# Patient Record
Sex: Female | Born: 1955 | Race: White | Hispanic: No | Marital: Married | Smoking: Current every day smoker
Health system: Southern US, Community
[De-identification: ages and names within clinical notes are randomized; demographics above are authoritative.]

## PROBLEM LIST (undated history)

## (undated) DIAGNOSIS — M1990 Unspecified osteoarthritis, unspecified site: Secondary | ICD-10-CM

## (undated) DIAGNOSIS — IMO0001 Reserved for inherently not codable concepts without codable children: Secondary | ICD-10-CM

## (undated) DIAGNOSIS — K219 Gastro-esophageal reflux disease without esophagitis: Secondary | ICD-10-CM

## (undated) DIAGNOSIS — D381 Neoplasm of uncertain behavior of trachea, bronchus and lung: Secondary | ICD-10-CM

## (undated) DIAGNOSIS — R42 Dizziness and giddiness: Secondary | ICD-10-CM

## (undated) HISTORY — PX: COLONOSCOPY: SHX174

## (undated) HISTORY — DX: Dizziness and giddiness: R42

## (undated) HISTORY — DX: Unspecified osteoarthritis, unspecified site: M19.90

## (undated) HISTORY — PX: COLON SURGERY: SHX602

## (undated) HISTORY — PX: BACK SURGERY: SHX140

## (undated) HISTORY — DX: Gastro-esophageal reflux disease without esophagitis: K21.9

## (undated) HISTORY — DX: Neoplasm of uncertain behavior of trachea, bronchus and lung: D38.1

---

## 2006-02-06 ENCOUNTER — Ambulatory Visit: Payer: Self-pay | Admitting: Internal Medicine

## 2006-12-23 HISTORY — PX: COLON RESECTION: SHX5231

## 2007-01-19 ENCOUNTER — Ambulatory Visit: Payer: Self-pay | Admitting: General Surgery

## 2007-02-04 ENCOUNTER — Other Ambulatory Visit: Payer: Self-pay

## 2007-02-11 ENCOUNTER — Inpatient Hospital Stay: Payer: Self-pay | Admitting: General Surgery

## 2007-03-20 ENCOUNTER — Ambulatory Visit: Payer: Self-pay | Admitting: Internal Medicine

## 2008-06-26 ENCOUNTER — Emergency Department: Payer: Self-pay | Admitting: Emergency Medicine

## 2008-12-08 ENCOUNTER — Emergency Department: Payer: Self-pay | Admitting: Internal Medicine

## 2010-03-10 ENCOUNTER — Emergency Department: Payer: Self-pay | Admitting: Unknown Physician Specialty

## 2010-03-19 ENCOUNTER — Ambulatory Visit: Payer: Self-pay | Admitting: Internal Medicine

## 2010-03-23 ENCOUNTER — Ambulatory Visit: Payer: Self-pay | Admitting: Oncology

## 2010-04-01 ENCOUNTER — Inpatient Hospital Stay: Payer: Self-pay | Admitting: Internal Medicine

## 2010-04-11 ENCOUNTER — Ambulatory Visit: Payer: Self-pay | Admitting: Oncology

## 2010-04-22 ENCOUNTER — Ambulatory Visit: Payer: Self-pay | Admitting: Oncology

## 2010-04-24 ENCOUNTER — Ambulatory Visit: Payer: Self-pay | Admitting: Cardiothoracic Surgery

## 2010-05-23 ENCOUNTER — Ambulatory Visit: Payer: Self-pay | Admitting: Oncology

## 2010-09-15 IMAGING — CR DG CHEST 2V
1 series · 2 of 2 positions shown · non-contrast
Comparison: none

REASON FOR EXAM: sob
COMMENTS:   May transport without cardiac monitor

[Series 1: view not recorded · 0.17mm/px · 2 of 2 slices shown]
[im 1/2]
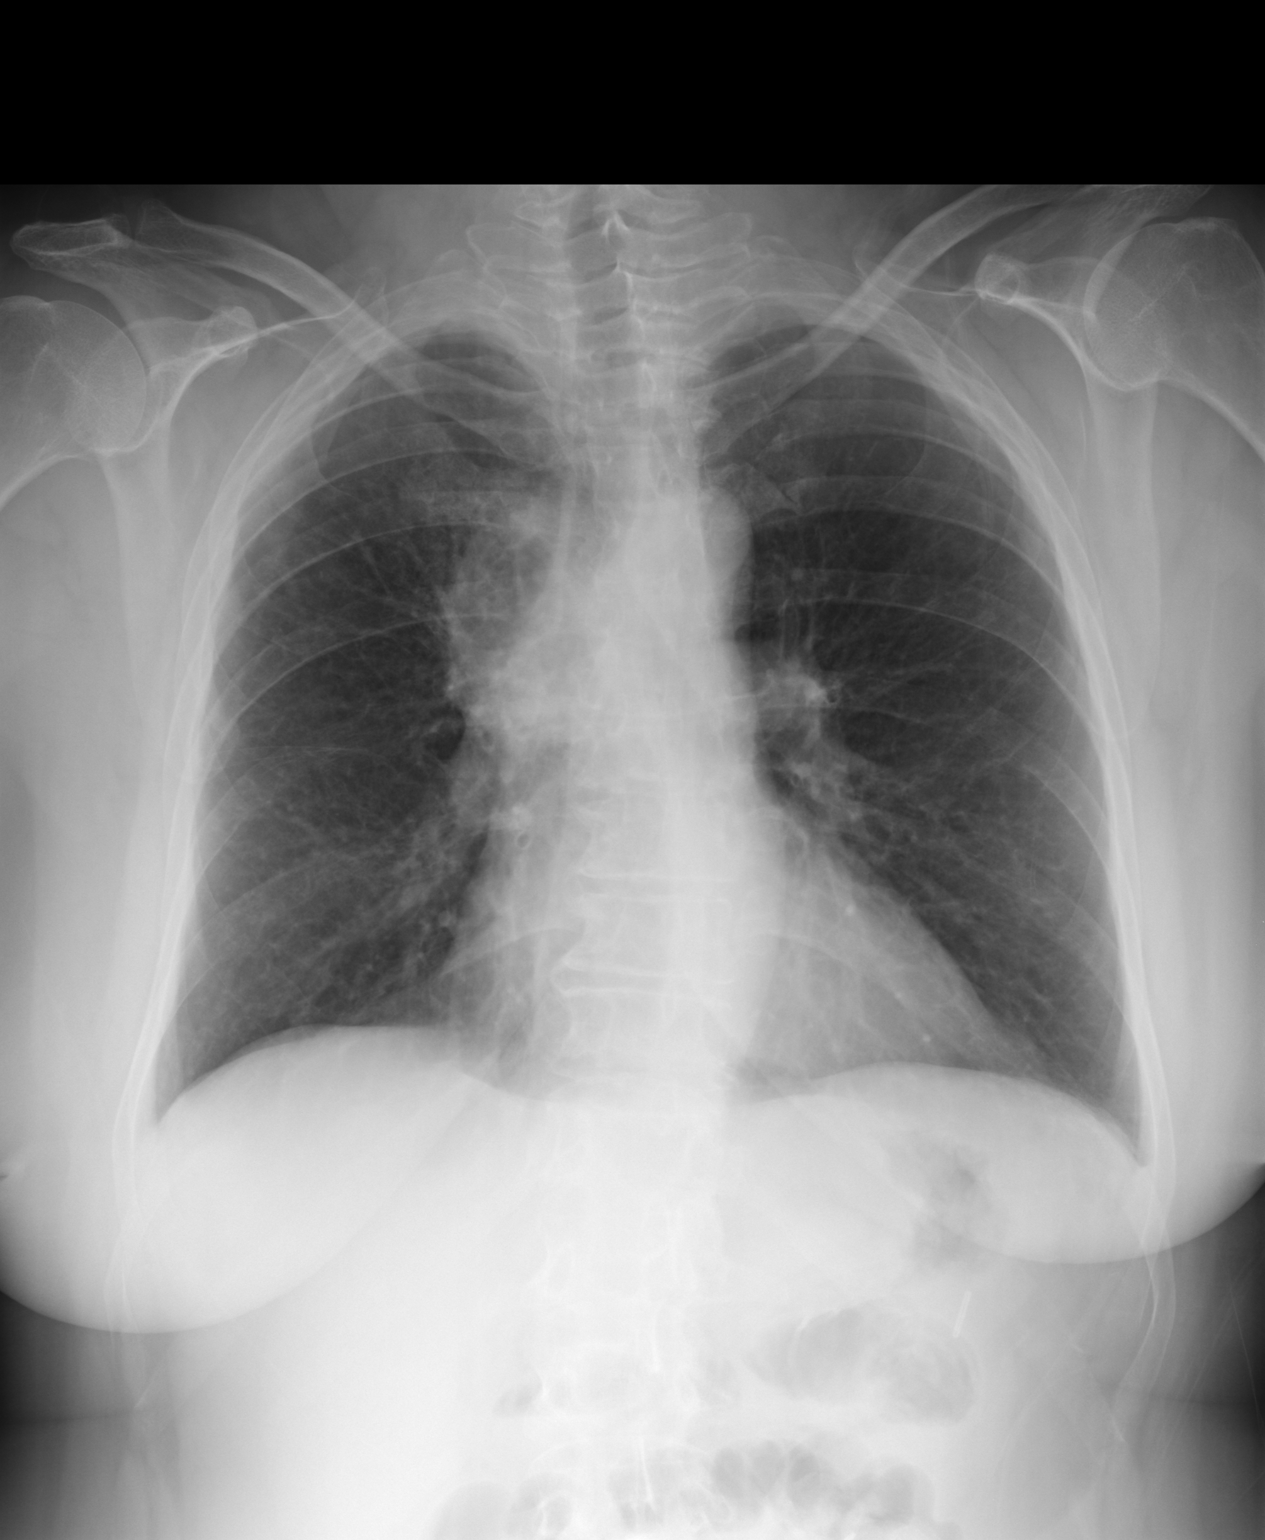
[im 2/2]
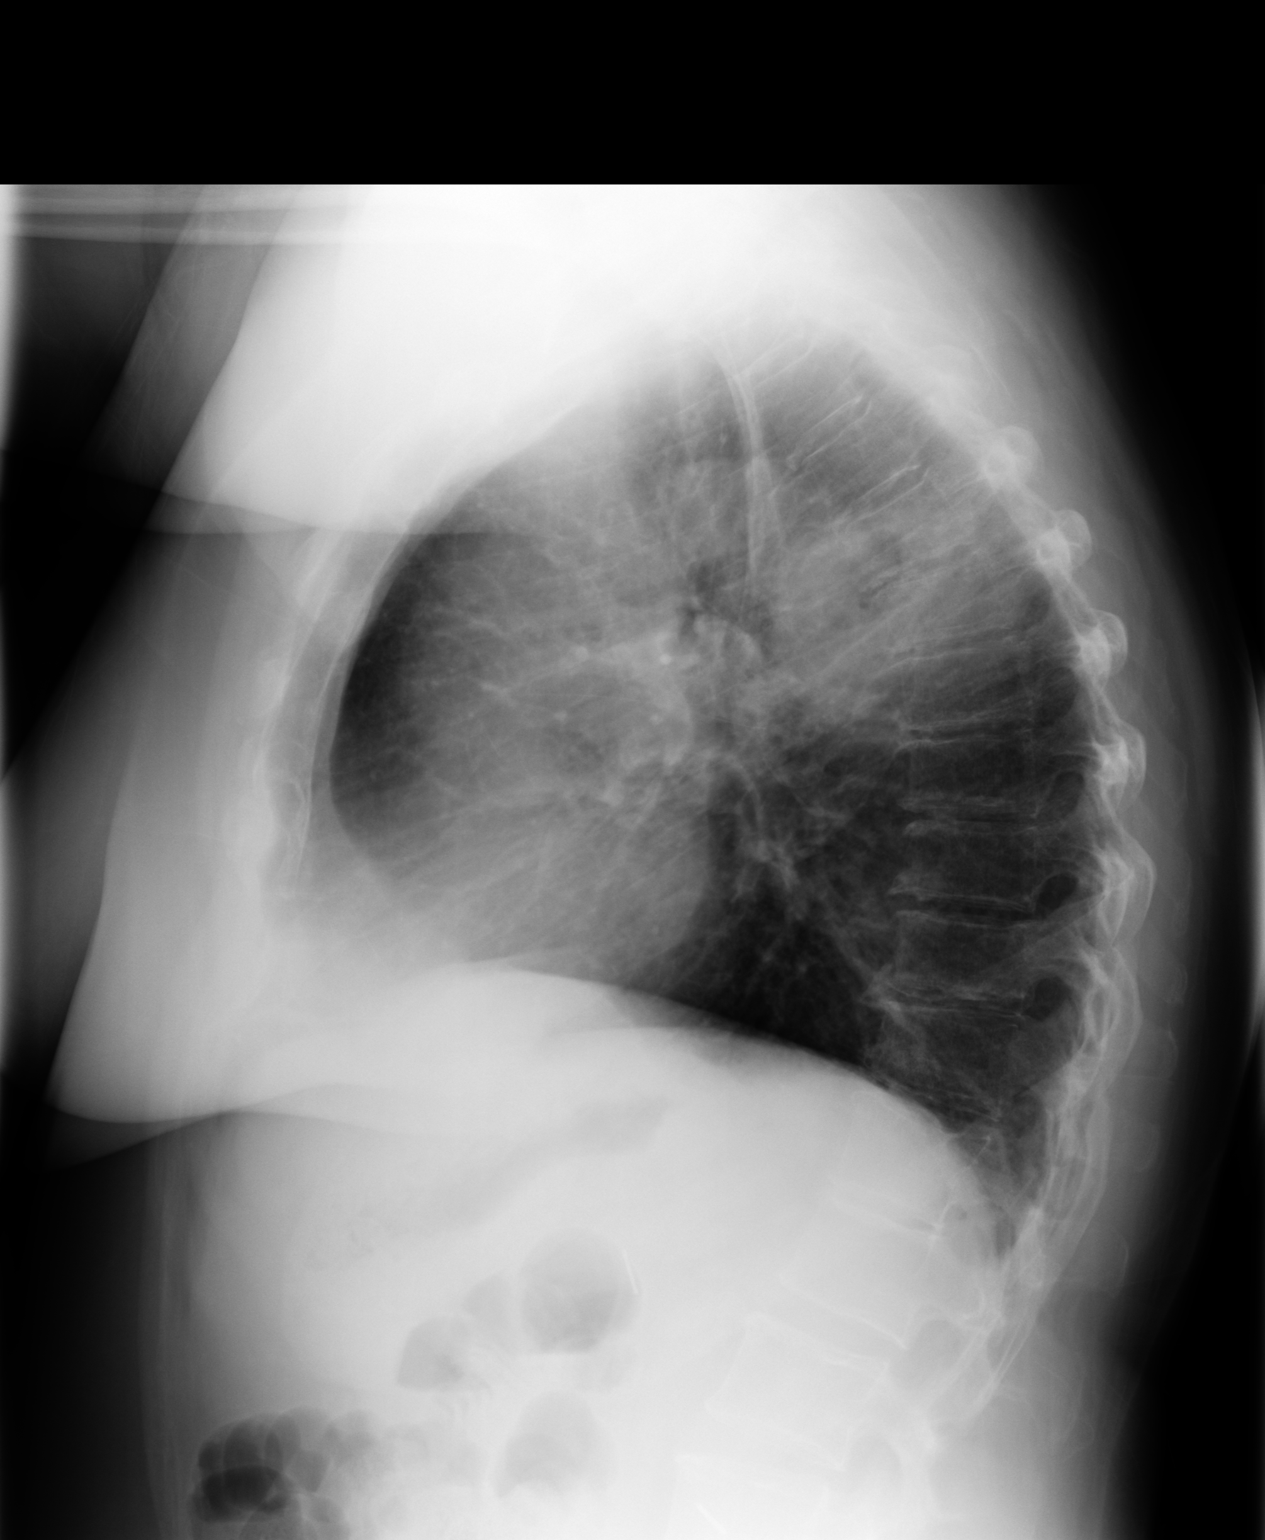

[2 of 2 positions shown; findings below may reference images not displayed]

PROCEDURE:     DXR - DXR CHEST PA (OR AP) AND LATERAL  - April 01, 2010  [DATE]

RESULT:     Comparison made to the study 19 March, 2010.

There is soft tissue fullness in the right paratracheal region. The lungs
are well-expanded. The heart is not enlarged and the pulmonary vascularity
is not engorged. I see no pleural effusion.
IMPRESSION: The findings are suspicious for a mass in the right
suprahilar region extending posteriorly likely in the superior segment of
the right lower lobe or inferior aspect of the right upper lobe. The
findings are worrisome for malignancy. Followup chest CT scanning would be
of value.

## 2010-10-08 IMAGING — CT CT CHEST W/O CM
1 series · 16 of 32 positions shown, 20 images · non-contrast
Comparison: none

REASON FOR EXAM: R Lung Mass
COMMENTS:

PROCEDURE:     CT  - CT CHEST WITHOUT CONTRAST  - April 24, 2010  [DATE]
RESULT:
HISTORY: Lung mass.

[Series 2: soft tissue · axial · 0.66mm/px · z∈[-449,-294]mm · 16 of 36 slices shown, 20 images]
[im 3/36  soft-tissue]
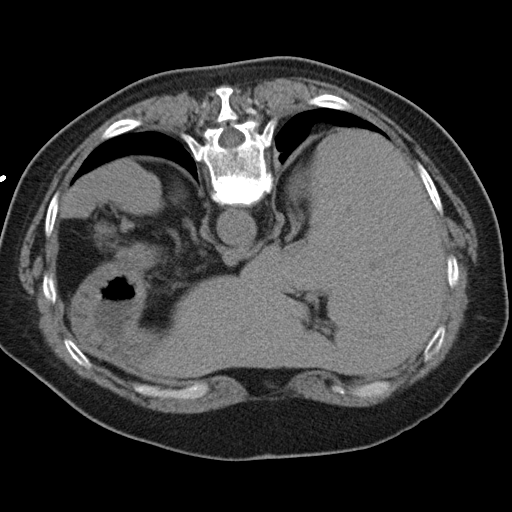
[im 3/36  bone]
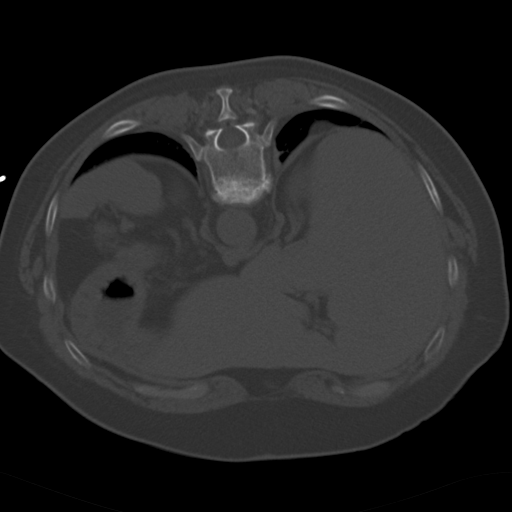
[im 5/36  soft-tissue]
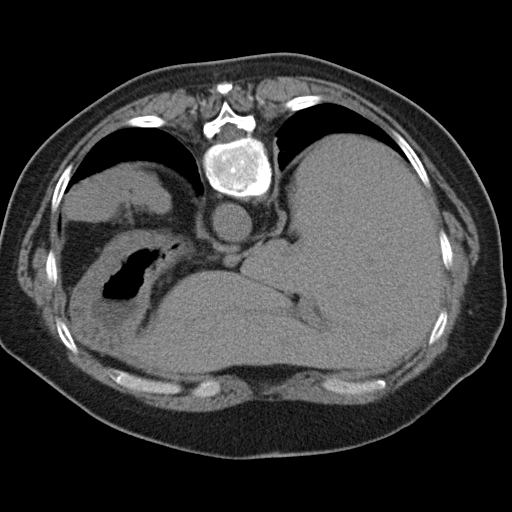
[im 7/36  soft-tissue]
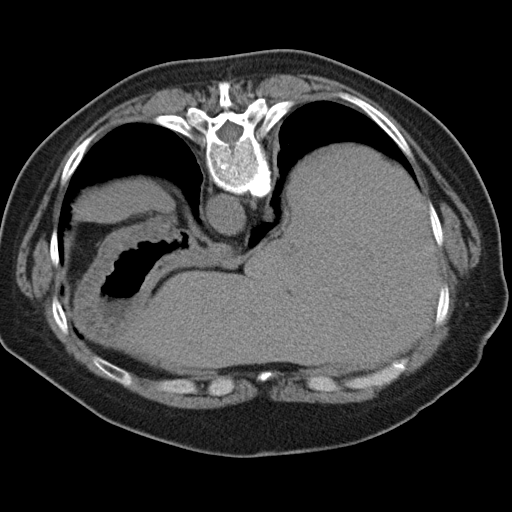
[im 10/36  soft-tissue]
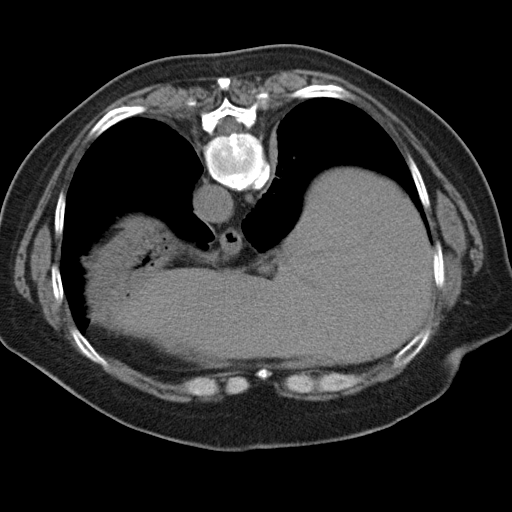
[im 12/36  soft-tissue]
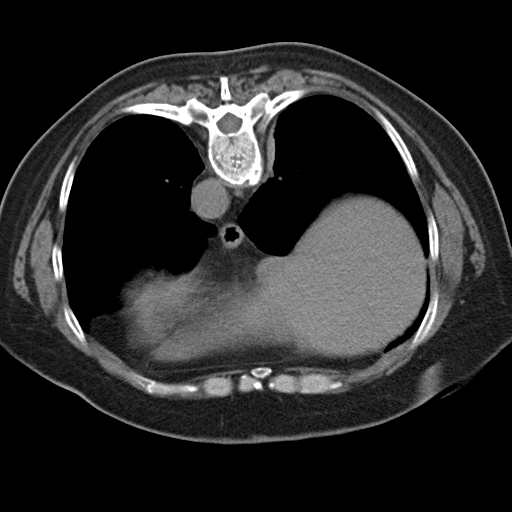
[im 14/36  soft-tissue]
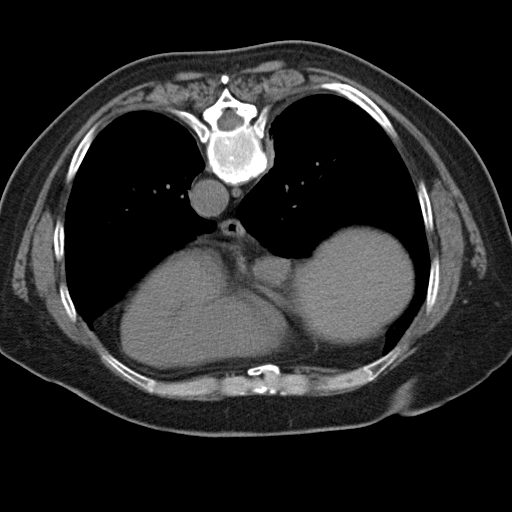
[im 16/36  soft-tissue]
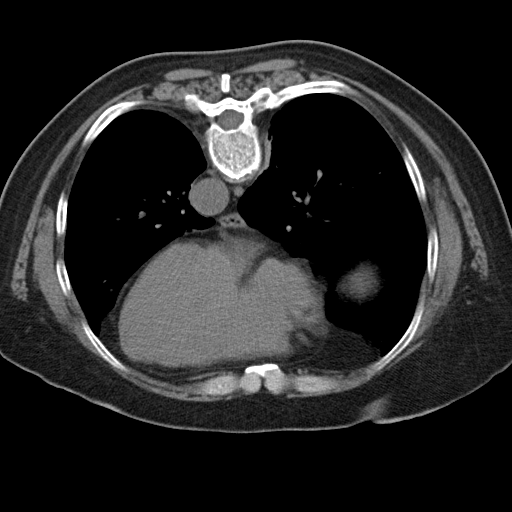
[im 20/36  soft-tissue]
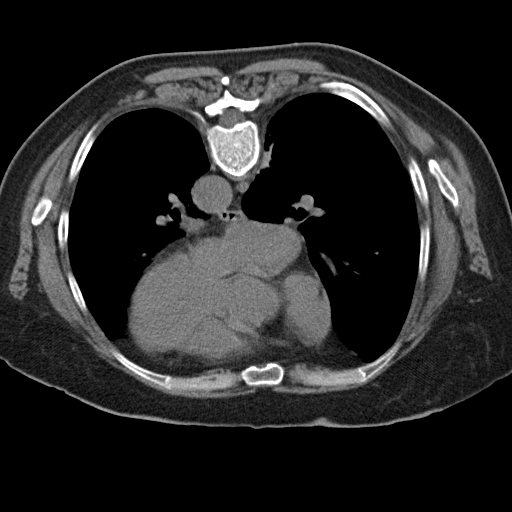
[im 22/36  soft-tissue]
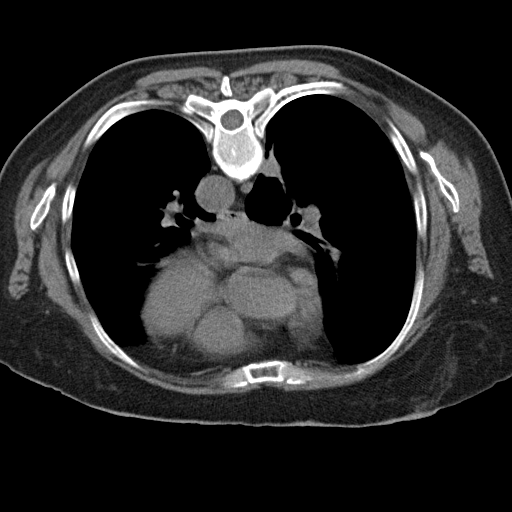
[im 22/36  bone]
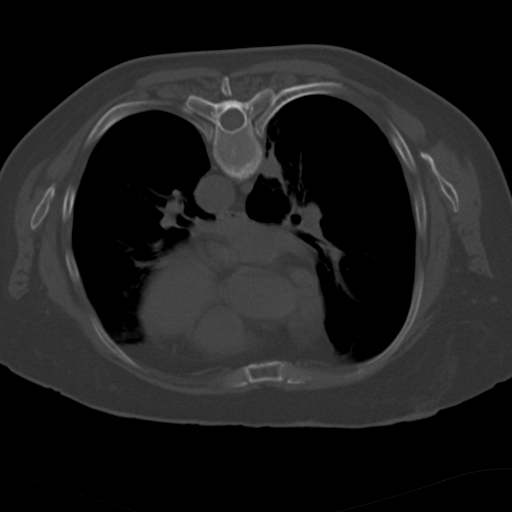
[im 24/36  soft-tissue]
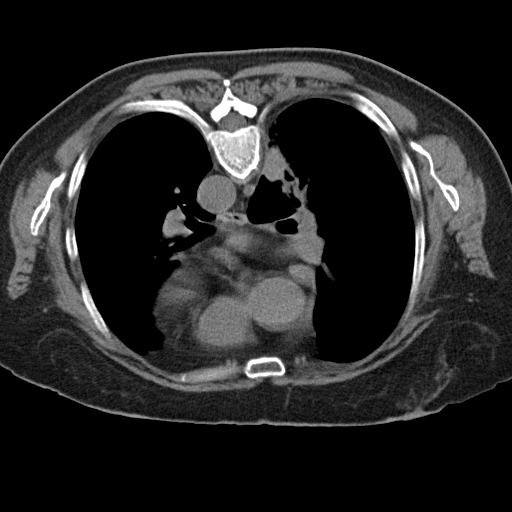
[im 26/36  soft-tissue]
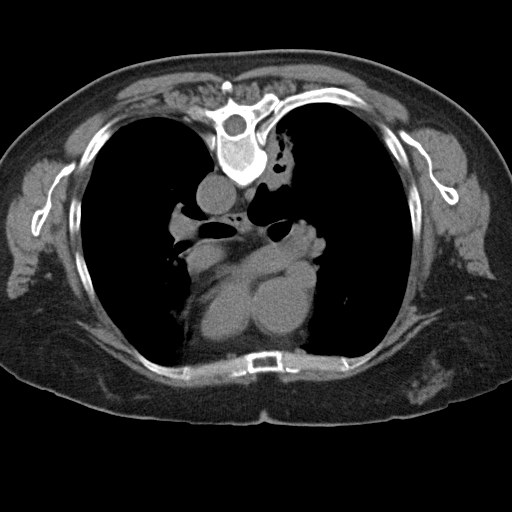
[im 29/36  soft-tissue]
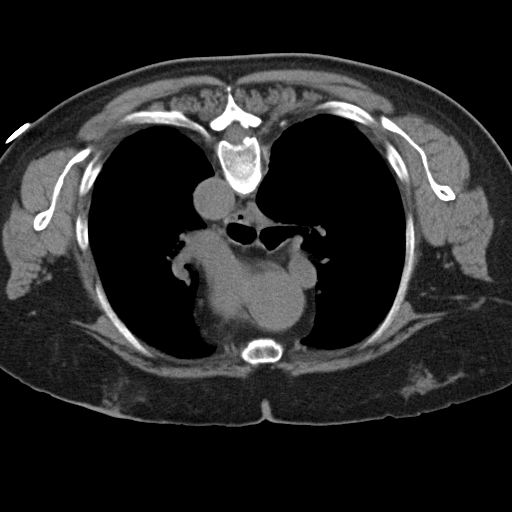
[im 31/36  soft-tissue]
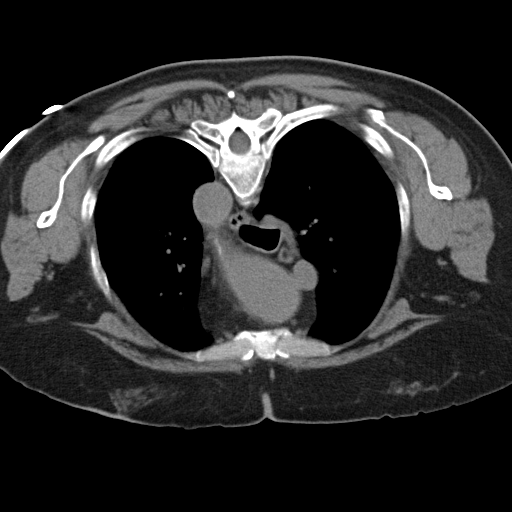
[im 31/36  lung]
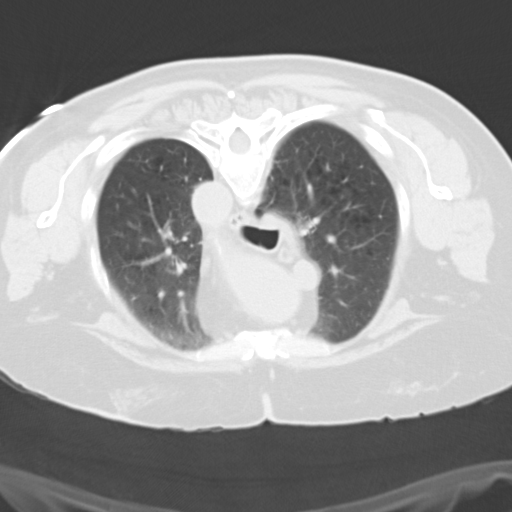
[im 32/36  lung]
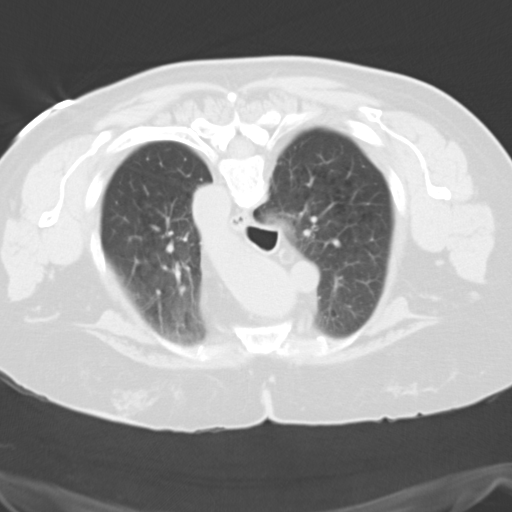
[im 33/36  soft-tissue]
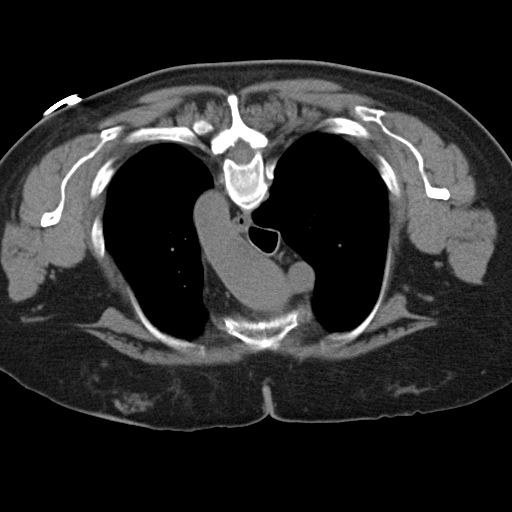
[im 33/36  lung]
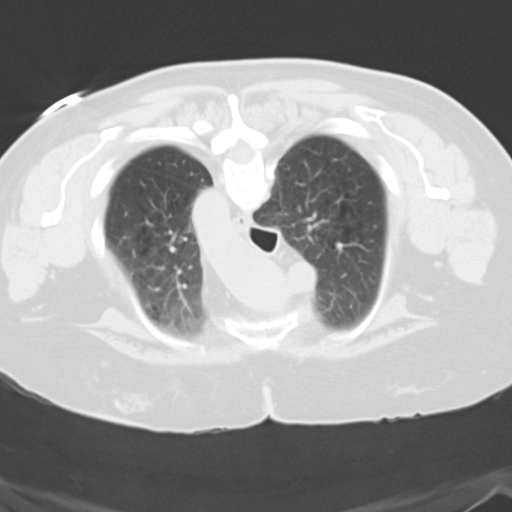
[im 34/36  lung]
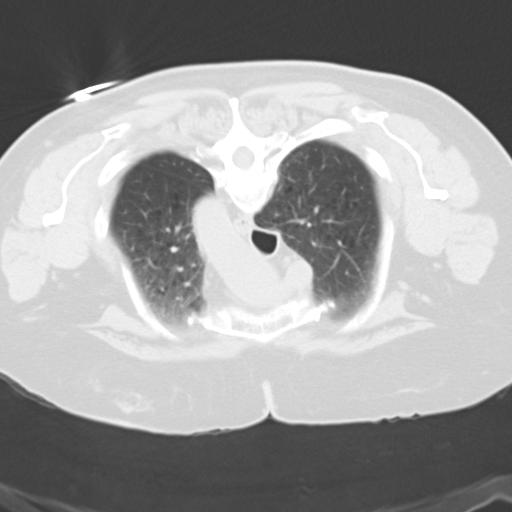

[16 of 32 positions shown; findings below may reference images not displayed]

COMPARISON STUDIES: No recent.

PROCEDURE AND FINDINGS:  CT of the chest was performed for pre-procedural
assessment of lung mass. The previously identified right lung base mass has
almost completely cleared indicating that this was an infectious process.
Follow-up chest CT is suggested to demonstrate complete clearing. No biopsy
was performed.
IMPRESSION: 1. Near complete clearing of right lung base mass. This indicates that this
mass was most likely infectious or inflammatory. No biopsy performed.
Follow-up chest x-ray is recommended to demonstrate complete clearing. Near
complete clearing again is present today.

## 2015-10-26 ENCOUNTER — Ambulatory Visit (INDEPENDENT_AMBULATORY_CARE_PROVIDER_SITE_OTHER): Payer: 59 | Admitting: Family Medicine

## 2015-10-26 ENCOUNTER — Encounter: Payer: Self-pay | Admitting: Family Medicine

## 2015-10-26 VITALS — BP 115/78 | HR 74 | Temp 98.7°F | Resp 16 | Ht 61.5 in | Wt 191.5 lb

## 2015-10-26 DIAGNOSIS — L720 Epidermal cyst: Secondary | ICD-10-CM | POA: Diagnosis not present

## 2015-10-26 NOTE — Progress Notes (Signed)
Date:  10/26/2015   Name:  Rachael Simpson   DOB:  [birth year]   MRN:  [id number]  PCP:  Dicky Doe, MD    Chief Complaint: Finger lesion  History of Present Illness:  This is a 59 y.o. female with several year hx lesion L third finger which comes and goes but seems a little larger and more tender lately. No discharge or erythema.  Review of Systems:  Review of Systems  Constitutional: Negative for fever and chills.    There are no active problems to display for this patient.   Prior to Admission medications   Medication Sig Start Date End Date Taking? Authorizing Provider  omeprazole (PRILOSEC) 20 MG capsule Take 20 mg by mouth daily.   Yes Historical Provider, MD    Allergies  Allergen Reactions  . Penicillins     No past surgical history on file.  Social History  Substance Use Topics  . Smoking status: Current Every Day Smoker  . Smokeless tobacco: Not on file  . Alcohol Use: No    No family history on file.  Medication list has been reviewed and updated.  Physical Examination: BP 115/78 mmHg  Pulse 74  Temp(Src) 98.7 F (37.1 C) (Oral)  Resp 16  Ht 5' 1.5" (1.562 m)  Wt 191 lb 8 oz (86.864 kg)  BMI 35.60 kg/m2  Physical Exam  Constitutional: She appears well-developed and well-nourished.  Neurological: She is alert.  Skin: Skin is warm and dry.  1 cm cystic lesion with central punctum over ulnar distal phalanx L third finger, mobile, no erythema or discharge  Nursing note and vitals reviewed.   Assessment and Plan:  1. Epidermoid cyst Verruca-freeze cryotherapy x 3 cycles, tolerated well   Return if symptoms worsen or fail to improve.  Satira Anis. Yves Fodor, [facility]  10/26/2015

## 2016-02-27 ENCOUNTER — Ambulatory Visit (INDEPENDENT_AMBULATORY_CARE_PROVIDER_SITE_OTHER): Payer: BLUE CROSS/BLUE SHIELD | Admitting: Family Medicine

## 2016-02-27 ENCOUNTER — Encounter: Payer: Self-pay | Admitting: Family Medicine

## 2016-02-27 VITALS — BP 150/90 | HR 64 | Temp 98.6°F | Resp 16 | Ht 63.0 in | Wt 187.0 lb

## 2016-02-27 DIAGNOSIS — K219 Gastro-esophageal reflux disease without esophagitis: Secondary | ICD-10-CM | POA: Diagnosis not present

## 2016-02-27 DIAGNOSIS — K635 Polyp of colon: Secondary | ICD-10-CM | POA: Diagnosis not present

## 2016-02-27 DIAGNOSIS — Z720 Tobacco use: Secondary | ICD-10-CM | POA: Diagnosis not present

## 2016-02-27 DIAGNOSIS — E669 Obesity, unspecified: Secondary | ICD-10-CM | POA: Diagnosis not present

## 2016-02-27 DIAGNOSIS — M1990 Unspecified osteoarthritis, unspecified site: Secondary | ICD-10-CM | POA: Diagnosis not present

## 2016-02-27 DIAGNOSIS — I10 Essential (primary) hypertension: Secondary | ICD-10-CM | POA: Diagnosis not present

## 2016-02-27 DIAGNOSIS — F17200 Nicotine dependence, unspecified, uncomplicated: Secondary | ICD-10-CM

## 2016-02-27 MED ORDER — ESOMEPRAZOLE MAGNESIUM 40 MG PO CPDR: 40.0000 mg | DELAYED_RELEASE_CAPSULE | Freq: Every day | ORAL | Status: AC

## 2016-02-27 NOTE — Progress Notes (Signed)
Name: Rachael Simpson   MRN: [id number]    DOB: 11-05-1956   Date:02/27/2016       Progress Note  Subjective  Chief Complaint  Chief Complaint  Patient presents with  . Gastroesophageal Reflux    wants labs if necessary        HPI Here for general medical care.  She has not had good general medical care in many years.  She had a colonoscopy many years ago by Dr. Jamal Collin and had some polyps removed.  She did not get her requested follow-up.  She has reflux sx, but not too severe.  Her BP has not been a problem in the past.  Has brother with T2DM,  Her other brother died from MI.  Father has Parkinson's disease.   She smokes about 1 ppd.  She has had some evidence of COPD in the past.  She was told that she might have had lung cancer in distant past, but turned out to just be pneumonia.   She had a Pneumovax in 2011 when she had pneumonia. No problem-specific assessment & plan notes found for this encounter.   Past Medical History  Diagnosis Date  . GERD (gastroesophageal reflux disease)   . Arthritis   . Bronchial adenoma   . Vertigo     History reviewed. No pertinent past surgical history.  Family History  Problem Relation Age of Onset  . Cancer Maternal Aunt   . Cancer Maternal Uncle     Social History   Social History  . Marital Status: Married    Spouse Name: N/A  . Number of Children: N/A  . Years of Education: N/A   Occupational History  . Not on file.   Social History Main Topics  . Smoking status: Current Every Day Smoker -- 1.00 packs/day  . Smokeless tobacco: Not on file  . Alcohol Use: No  . Drug Use: No  . Sexual Activity: Not on file   Other Topics Concern  . Not on file   Social History Narrative     Current outpatient prescriptions:  .  omeprazole (PRILOSEC) 20 MG capsule, Take 20 mg by mouth daily., Disp: , Rfl:  .  RaNITidine HCl (ZANTAC PO), Take by mouth., Disp: , Rfl:  .  esomeprazole (NEXIUM) 40 MG capsule, Take 1 capsule (40 mg total) by  mouth daily., Disp: 30 capsule, Rfl: 12  Allergies  Allergen Reactions  . Penicillins      Review of Systems  Constitutional: Negative for fever, chills, weight loss and malaise/fatigue.  HENT: Negative for hearing loss.   Eyes: Negative for blurred vision and double vision.  Respiratory: Negative for cough, shortness of breath and wheezing.   Cardiovascular: Negative for chest pain, palpitations and leg swelling.  Gastrointestinal: Positive for heartburn. Negative for nausea, vomiting, abdominal pain, constipation and blood in stool.  Genitourinary: Negative for dysuria, urgency and frequency.  Musculoskeletal: Negative for myalgias and joint pain.  Skin: Negative for rash.  Neurological: Negative for dizziness, tremors, weakness and headaches.      Objective  Filed Vitals:   02/27/16 1336 02/27/16 1427  BP: 133/90 150/90  Pulse: 64   Temp: 98.6 F (37 C)   Resp: 16   Height: 5\' 3"  (1.6 m)   Weight: 187 lb (84.823 kg)     Physical Exam  Constitutional: She is oriented to person, place, and time and well-developed, well-nourished, and in no distress. No distress.  HENT:  Head: Normocephalic and atraumatic.  Eyes:  Conjunctivae and EOM are normal. Pupils are equal, round, and reactive to light. No scleral icterus.  Neck: Normal range of motion. Neck supple. Carotid bruit is not present. No thyromegaly present.  Cardiovascular: Normal rate, regular rhythm and normal heart sounds.  Exam reveals no gallop and no friction rub.   No murmur heard. Pulmonary/Chest: Effort normal and breath sounds normal. No respiratory distress. She has no wheezes. She has no rales.  Abdominal: Soft. Bowel sounds are normal. She exhibits no distension and no mass. There is no tenderness.  Musculoskeletal: She exhibits no edema.  Lymphadenopathy:    She has no cervical adenopathy.  Neurological: She is alert and oriented to person, place, and time.  Vitals reviewed.      No results found  for this or any previous visit (from the past 2160 hour(s)).   Assessment & Plan  Problem List Items Addressed This Visit      Cardiovascular and Mediastinum   HBP (high blood pressure)   Relevant Orders   Comprehensive Metabolic Panel (CMET)     Digestive   GERD (gastroesophageal reflux disease) - Primary   Relevant Medications   RaNITidine HCl (ZANTAC PO)   esomeprazole (NEXIUM) 40 MG capsule   Other Relevant Orders   CBC with Differential   Colon polyps   Relevant Orders   CBC with Differential   Ambulatory referral to General Surgery     Musculoskeletal and Integument   Arthritis     Other   Obesity   Relevant Orders   Lipid Profile   TSH   Smoker      Meds ordered this encounter  Medications  . RaNITidine HCl (ZANTAC PO)    Sig: Take by mouth.  . DISCONTD: esomeprazole (NEXIUM) 10 MG packet    Sig: Take 10 mg by mouth daily before breakfast.  . esomeprazole (NEXIUM) 40 MG capsule    Sig: Take 1 capsule (40 mg total) by mouth daily.    Dispense:  30 capsule    Refill:  12   1. Gastroesophageal reflux disease, esophagitis presence not specified  - CBC with Differential - esomeprazole (NEXIUM) 40 MG capsule; Take 1 capsule (40 mg total) by mouth daily.  Dispense: 30 capsule; Refill: 12  2. Arthritis   3. Colon polyps  - CBC with Differential - Ambulatory referral to General Surgery  4. Essential hypertension  - Comprehensive Metabolic Panel (CMET)  5. Obesity  - Lipid Profile - TSH  6. Smoker

## 2016-02-27 NOTE — Patient Instructions (Signed)
Disc used weight loss and stopping smoking.

## 2016-02-29 ENCOUNTER — Encounter: Payer: Self-pay | Admitting: *Deleted

## 2016-03-19 ENCOUNTER — Encounter: Payer: Self-pay | Admitting: General Surgery

## 2016-03-19 ENCOUNTER — Ambulatory Visit (INDEPENDENT_AMBULATORY_CARE_PROVIDER_SITE_OTHER): Payer: BLUE CROSS/BLUE SHIELD | Admitting: General Surgery

## 2016-03-19 VITALS — BP 130/74 | HR 78 | Resp 12 | Ht 62.0 in | Wt 178.0 lb

## 2016-03-19 DIAGNOSIS — Z86010 Personal history of colonic polyps: Secondary | ICD-10-CM | POA: Diagnosis not present

## 2016-03-19 NOTE — Progress Notes (Addendum)
Patient ID: Rachael Simpson, female   DOB: 18-Apr-1956, 60 y.o.   MRN: [id number]  Chief Complaint  Patient presents with  . Colonoscopy    HPI Rachael Simpson is a 60 y.o. female here today for a evaluation of an colonoscopy. Last colonoscopy 01/19/2007. NO GI problems at this time.  I have reviewed the history of present illness with the patient.  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 She did not follow ups as recommended after- had to take care of her mother with dementia and had lost insurance for a while.  Past Medical History  Diagnosis Date  . GERD (gastroesophageal reflux disease)   . Arthritis   . Bronchial adenoma   . Vertigo     Past Surgical History  Procedure Laterality Date  . Colonoscopy  2008,  . Back surgery    . Colon resection Right 2008    Family History  Problem Relation Age of Onset  . Cancer Maternal Aunt   . Cancer Maternal Uncle     Social History Social History  Substance Use Topics  . Smoking status: Current Every Day Smoker -- 1.00 packs/day  . Smokeless tobacco: None  . Alcohol Use: No    Allergies  Allergen Reactions  . Penicillins     Current Outpatient Prescriptions  Medication Sig Dispense Refill  . esomeprazole (NEXIUM) 40 MG capsule Take 1 capsule (40 mg total) by mouth daily. 30 capsule 12  . polyethylene glycol powder (GLYCOLAX/MIRALAX) powder 255 grams one bottle for colonoscopy prep 255 g 0   No current facility-administered medications for this visit.    Review of Systems Review of Systems  Constitutional: Negative.   Respiratory: Negative.   Cardiovascular: Negative.     Blood pressure 130/74, pulse 78, resp. rate 12, height 5\' 2"  (1.575 m), weight 178 lb (80.74 kg).  Physical Exam Physical Exam  Constitutional: She is oriented to person, place, and time. She appears well-developed and well-nourished.  Eyes: Conjunctivae are normal. No scleral icterus.  Neck: Neck  supple. No thyromegaly present.  Cardiovascular: Normal rate, regular rhythm and normal heart sounds.   Pulmonary/Chest: Effort normal and breath sounds normal.  Abdominal: Soft. Bowel sounds are normal. There is no hepatomegaly. There is no tenderness. No hernia.  Lymphadenopathy:    She has no cervical adenopathy.  Neurological: She is alert and oriented to person, place, and time.  Skin: Skin is warm and dry.    Data Reviewed Colonoscopy and path reviewed   Assessment    History of colon polyp. S/P right colectomy in 2008    Plan        Colonoscopy with possible biopsy/polypectomy prn: Information regarding the procedure, including its potential risks and complications (including but not limited to perforation of the bowel, which may require emergency surgery to repair, and bleeding) was verbally given to the patient. Educational information regarding lower intestinal endoscopy was given to the patient. Written instructions for how to complete the bowel prep using Miralax were provided. The importance of drinking ample fluids to avoid dehydration as a result of the prep emphasized.  Patient has been scheduled for a colonoscopy on 05-01-16 at [facility].   PCP:  Philbert Riser, Nikki Dom This information has been scribed by Gaspar Cola CMA.    SANKAR,SEEPLAPUTHUR G 03/20/2016, 3:46 PM

## 2016-03-19 NOTE — Patient Instructions (Addendum)
Colonoscopy A colonoscopy is an exam to look at the entire large intestine (colon). This exam can help find problems such as tumors, polyps, inflammation, and areas of bleeding. The exam takes about 1 hour.  LET [facility] CARE PROVIDER KNOW ABOUT:   Any allergies you have.  All medicines you are taking, including vitamins, herbs, eye drops, creams, and over-the-counter medicines.  Previous problems you or members of your family have had with the use of anesthetics.  Any blood disorders you have.  Previous surgeries you have had.  Medical conditions you have. RISKS AND COMPLICATIONS  Generally, this is a safe procedure. However, as with any procedure, complications can occur. Possible complications include:  Bleeding.  Tearing or rupture of the colon wall.  Reaction to medicines given during the exam.  Infection (rare). BEFORE THE PROCEDURE   Ask your health care provider about changing or stopping your regular medicines.  You may be prescribed an oral bowel prep. This involves drinking a large amount of medicated liquid, starting the day before your procedure. The liquid will cause you to have multiple loose stools until your stool is almost clear or light green. This cleans out your colon in preparation for the procedure.  Do not eat or drink anything else once you have started the bowel prep, unless your health care provider tells you it is safe to do so.  Arrange for someone to drive you home after the procedure. PROCEDURE   You will be given medicine to help you relax (sedative).  You will lie on your side with your knees bent.  A long, flexible tube with a light and camera on the end (colonoscope) will be inserted through the rectum and into the colon. The camera sends video back to a computer screen as it moves through the colon. The colonoscope also releases carbon dioxide gas to inflate the colon. This helps your health care provider see the area better.  During  the exam, your health care provider may take a small tissue sample (biopsy) to be examined under a microscope if any abnormalities are found.  The exam is finished when the entire colon has been viewed. AFTER THE PROCEDURE   Do not drive for 24 hours after the exam.  You may have a small amount of blood in your stool.  You may pass moderate amounts of gas and have mild abdominal cramping or bloating. This is caused by the gas used to inflate your colon during the exam.  Ask when your test results will be ready and how you will get your results. Make sure you get your test results.   This information is not intended to replace advice given to you by your health care provider. Make sure you discuss any questions you have with your health care provider.   Document Released: 12/06/2000 Document Revised: 09/29/2013 Document Reviewed: 08/16/2013 Elsevier Interactive Patient Education [employer].  Patient has been scheduled for a colonoscopy on 05-01-16 at [facility].

## 2016-03-20 ENCOUNTER — Encounter: Payer: Self-pay | Admitting: General Surgery

## 2016-03-20 ENCOUNTER — Telehealth: Payer: Self-pay | Admitting: *Deleted

## 2016-03-20 MED ORDER — POLYETHYLENE GLYCOL 3350 17 GM/SCOOP PO POWD: ORAL | Status: DC

## 2016-03-20 NOTE — Telephone Encounter (Signed)
Message for patient to call the office.   We need to inform patient that we have her scheduled for a colonoscopy with Dr. Jamal Collin for 05-01-16 at [facility].  Miralax prescription has been sent in to her pharmacy electronically.

## 2016-03-21 NOTE — Telephone Encounter (Signed)
Patient aware of colonoscopy date and that her Miralax was sent to the pharmacy

## 2016-04-11 ENCOUNTER — Ambulatory Visit: Payer: BLUE CROSS/BLUE SHIELD | Admitting: Family Medicine

## 2016-04-26 ENCOUNTER — Other Ambulatory Visit: Payer: Self-pay | Admitting: General Surgery

## 2016-04-30 ENCOUNTER — Encounter: Payer: Self-pay | Admitting: *Deleted

## 2016-05-01 ENCOUNTER — Ambulatory Visit: Payer: BLUE CROSS/BLUE SHIELD | Admitting: Anesthesiology

## 2016-05-01 ENCOUNTER — Encounter: Payer: Self-pay | Admitting: *Deleted

## 2016-05-01 ENCOUNTER — Encounter
Admission: RE | Disposition: A | Discharge status: Home / Self Care | Payer: Self-pay | Source: Ambulatory Visit | Attending: General Surgery

## 2016-05-01 ENCOUNTER — Ambulatory Visit
Admission: RE | Admit: 2016-05-01 | Discharge: 2016-05-01 | Disposition: A | Discharge status: Home / Self Care | Payer: BLUE CROSS/BLUE SHIELD | Source: Ambulatory Visit | Attending: General Surgery | Admitting: General Surgery

## 2016-05-01 DIAGNOSIS — Z880 Allergy status to penicillin: Secondary | ICD-10-CM | POA: Insufficient documentation

## 2016-05-01 DIAGNOSIS — Z79899 Other long term (current) drug therapy: Secondary | ICD-10-CM | POA: Insufficient documentation

## 2016-05-01 DIAGNOSIS — K5730 Diverticulosis of large intestine without perforation or abscess without bleeding: Secondary | ICD-10-CM | POA: Diagnosis not present

## 2016-05-01 DIAGNOSIS — K621 Rectal polyp: Secondary | ICD-10-CM | POA: Insufficient documentation

## 2016-05-01 DIAGNOSIS — F17210 Nicotine dependence, cigarettes, uncomplicated: Secondary | ICD-10-CM | POA: Diagnosis not present

## 2016-05-01 DIAGNOSIS — K219 Gastro-esophageal reflux disease without esophagitis: Secondary | ICD-10-CM | POA: Diagnosis not present

## 2016-05-01 DIAGNOSIS — J449 Chronic obstructive pulmonary disease, unspecified: Secondary | ICD-10-CM | POA: Diagnosis not present

## 2016-05-01 DIAGNOSIS — Z86010 Personal history of colonic polyps: Secondary | ICD-10-CM | POA: Diagnosis not present

## 2016-05-01 DIAGNOSIS — Z1211 Encounter for screening for malignant neoplasm of colon: Secondary | ICD-10-CM | POA: Diagnosis not present

## 2016-05-01 DIAGNOSIS — M1990 Unspecified osteoarthritis, unspecified site: Secondary | ICD-10-CM | POA: Insufficient documentation

## 2016-05-01 HISTORY — DX: Reserved for inherently not codable concepts without codable children: IMO0001

## 2016-05-01 HISTORY — PX: COLONOSCOPY WITH PROPOFOL: SHX5780

## 2016-05-01 LAB — CBC WITH DIFFERENTIAL/PLATELET
Basophils Absolute: 0.1 10*3/uL (ref 0.0–0.2)
Basos: 1 %
EOS (ABSOLUTE): 0.2 10*3/uL (ref 0.0–0.4)
Eos: 3 %
Hematocrit: 41.8 % (ref 34.0–46.6)
Hemoglobin: 13.9 g/dL (ref 11.1–15.9)
Immature Grans (Abs): 0 10*3/uL (ref 0.0–0.1)
Immature Granulocytes: 0 %
Lymphocytes Absolute: 3.7 10*3/uL — ABNORMAL HIGH (ref 0.7–3.1)
Lymphs: 40 %
MCH: 29.9 pg (ref 26.6–33.0)
MCHC: 33.3 g/dL (ref 31.5–35.7)
MCV: 90 fL (ref 79–97)
Monocytes Absolute: 0.7 10*3/uL (ref 0.1–0.9)
Monocytes: 7 %
NEUTROS ABS: 4.4 10*3/uL (ref 1.4–7.0)
Neutrophils: 49 %
PLATELETS: 456 10*3/uL — AB (ref 150–379)
RBC: 4.65 x10E6/uL (ref 3.77–5.28)
RDW: 15.5 % — ABNORMAL HIGH (ref 12.3–15.4)
WBC: 9.1 10*3/uL (ref 3.4–10.8)

## 2016-05-01 LAB — COMPREHENSIVE METABOLIC PANEL
A/G RATIO: 1.4 (ref 1.2–2.2)
ALT: 7 IU/L (ref 0–32)
AST: 10 IU/L (ref 0–40)
Albumin: 4 g/dL (ref 3.6–4.8)
Alkaline Phosphatase: 122 IU/L — ABNORMAL HIGH (ref 39–117)
BILIRUBIN TOTAL: 0.3 mg/dL (ref 0.0–1.2)
BUN/Creatinine Ratio: 5 — ABNORMAL LOW (ref 12–28)
BUN: 4 mg/dL — ABNORMAL LOW (ref 8–27)
CALCIUM: 9 mg/dL (ref 8.7–10.3)
CHLORIDE: 97 mmol/L (ref 96–106)
CO2: 21 mmol/L (ref 18–29)
Creatinine, Ser: 0.75 mg/dL (ref 0.57–1.00)
GFR calc Af Amer: 99 mL/min/{1.73_m2} (ref >59)
GFR, EST NON AFRICAN AMERICAN: 86 mL/min/{1.73_m2} (ref >59)
GLOBULIN, TOTAL: 2.9 g/dL (ref 1.5–4.5)
Glucose: 91 mg/dL (ref 65–99)
Potassium: 4.9 mmol/L (ref 3.5–5.2)
Sodium: 142 mmol/L (ref 134–144)
Total Protein: 6.9 g/dL (ref 6.0–8.5)

## 2016-05-01 LAB — LIPID PANEL
CHOLESTEROL TOTAL: 218 mg/dL — AB (ref 100–199)
Chol/HDL Ratio: 4.8 ratio units — ABNORMAL HIGH (ref 0.0–4.4)
HDL: 45 mg/dL (ref >39)
LDL Calculated: 138 mg/dL — ABNORMAL HIGH (ref 0–99)
TRIGLYCERIDES: 177 mg/dL — AB (ref 0–149)
VLDL Cholesterol Cal: 35 mg/dL (ref 5–40)

## 2016-05-01 LAB — TSH: TSH: 1.27 u[IU]/mL (ref 0.450–4.500)

## 2016-05-01 SURGERY — COLONOSCOPY WITH PROPOFOL
Anesthesia: General

## 2016-05-01 MED ORDER — PROPOFOL 500 MG/50ML IV EMUL
INTRAVENOUS | Status: DC | PRN
  Administered 2016-05-01: 150 ug/kg/min via INTRAVENOUS

## 2016-05-01 MED ORDER — PROPOFOL 10 MG/ML IV BOLUS
INTRAVENOUS | Status: DC | PRN
  Administered 2016-05-01: 80 mg via INTRAVENOUS

## 2016-05-01 MED ORDER — SODIUM CHLORIDE 0.9 % IV SOLN
INTRAVENOUS | Status: DC
  Administered 2016-05-01: 1000 mL via INTRAVENOUS

## 2016-05-01 MED ORDER — LACTATED RINGERS IV SOLN
INTRAVENOUS | Status: DC | PRN
  Administered 2016-05-01: 09:00:00 via INTRAVENOUS

## 2016-05-01 NOTE — Anesthesia Postprocedure Evaluation (Signed)
Anesthesia Post Note  Patient: Rachael Simpson  Procedure(s) Performed: Procedure(s) (LRB): COLONOSCOPY WITH PROPOFOL (N/A)  Patient location during evaluation: Endoscopy Anesthesia Type: General Level of consciousness: awake and alert Pain management: pain level controlled Vital Signs Assessment: post-procedure vital signs reviewed and stable Respiratory status: spontaneous breathing, nonlabored ventilation, respiratory function stable and patient connected to nasal cannula oxygen Cardiovascular status: blood pressure returned to baseline and stable Postop Assessment: no signs of nausea or vomiting Anesthetic complications: no    Last Vitals:  Filed Vitals:   05/01/16 0948 05/01/16 0950  BP: 92/59 92/59  Pulse: 78   Temp: 36.3 C   Resp: 19     Last Pain: There were no vitals filed for this visit.               Precious Haws Moya Duan

## 2016-05-01 NOTE — Transfer of Care (Signed)
Immediate Anesthesia Transfer of Care Note  Patient: Rachael Simpson  Procedure(s) Performed: Procedure(s): COLONOSCOPY WITH PROPOFOL (N/A)  Patient Location: PACU and Endoscopy Unit  Anesthesia Type:General  Level of Consciousness: awake, alert  and oriented  Airway & Oxygen Therapy: Patient Spontanous Breathing and Patient connected to face mask oxygen  Post-op Assessment: Report given to RN and Post -op Vital signs reviewed and stable  Post vital signs: Reviewed and stable  Last Vitals:  Filed Vitals:   05/01/16 0820 05/01/16 0948  BP: 127/84 92/59  Pulse: 67 78  Temp: 35.8 C 36.3 C  Resp: 20 19    Last Pain: There were no vitals filed for this visit.       Complications: No apparent anesthesia complications

## 2016-05-01 NOTE — H&P (Addendum)
Rachael Simpson is an 60 y.o. female.   Chief Complaint: here for scheduled colonoscopy HPI: Pt has had prior right colectomy for large cecal polyp with dysolasia. She is here for surveillance colonoscopy-overdue as she has missed her scheduled f/u in 2013. No GI com[laints.  Past Medical History  Diagnosis Date  . GERD (gastroesophageal reflux disease)   . Arthritis   . Bronchial adenoma   . Vertigo   . Shortness of breath dyspnea     Past Surgical History  Procedure Laterality Date  . Colonoscopy  2008,  . Back surgery    . Colon resection Right 2008  . Colon surgery      Family History  Problem Relation Age of Onset  . Cancer Maternal Aunt   . Cancer Maternal Uncle    Social History:  reports that she has been smoking.  She does not have any smokeless tobacco history on file. She reports that she does not drink alcohol or use illicit drugs.  Allergies:  Allergies  Allergen Reactions  . Penicillins     Medications Prior to Admission  Medication Sig Dispense Refill  . esomeprazole (NEXIUM) 40 MG capsule Take 1 capsule (40 mg total) by mouth daily. 30 capsule 12  . polyethylene glycol powder (GLYCOLAX/MIRALAX) powder 255 grams one bottle for colonoscopy prep 255 g 0    Results for orders placed or performed in visit on 02/27/16 (from the past 48 hour(s))  Comprehensive Metabolic Panel (CMET)     Status: Abnormal   Collection Time: 04/30/16  9:04 AM  Result Value Ref Range   Glucose 91 65 - 99 mg/dL   BUN 4 (L) 8 - 27 mg/dL   Creatinine, Ser 0.75 0.57 - 1.00 mg/dL   GFR calc non Af Amer 86 >59 mL/min/1.73   GFR calc Af Amer 99 >59 mL/min/1.73   BUN/Creatinine Ratio 5 (L) 12 - 28   Sodium 142 134 - 144 mmol/L   Potassium 4.9 3.5 - 5.2 mmol/L   Chloride 97 96 - 106 mmol/L   CO2 21 18 - 29 mmol/L   Calcium 9.0 8.7 - 10.3 mg/dL   Total Protein 6.9 6.0 - 8.5 g/dL   Albumin 4.0 3.6 - 4.8 g/dL   Globulin, Total 2.9 1.5 - 4.5 g/dL   Albumin/Globulin Ratio 1.4 1.2 -  2.2   Bilirubin Total 0.3 0.0 - 1.2 mg/dL   Alkaline Phosphatase 122 (H) 39 - 117 IU/L   AST 10 0 - 40 IU/L   ALT 7 0 - 32 IU/L  CBC with Differential     Status: Abnormal   Collection Time: 04/30/16  9:04 AM  Result Value Ref Range   WBC 9.1 3.4 - 10.8 x10E3/uL   RBC 4.65 3.77 - 5.28 x10E6/uL   Hemoglobin 13.9 11.1 - 15.9 g/dL   Hematocrit 41.8 34.0 - 46.6 %   MCV 90 79 - 97 fL   MCH 29.9 26.6 - 33.0 pg   MCHC 33.3 31.5 - 35.7 g/dL   RDW 15.5 (H) 12.3 - 15.4 %   Platelets 456 (H) 150 - 379 x10E3/uL   Neutrophils 49 %   Lymphs 40 %   Monocytes 7 %   Eos 3 %   Basos 1 %   Neutrophils Absolute 4.4 1.4 - 7.0 x10E3/uL   Lymphocytes Absolute 3.7 (H) 0.7 - 3.1 x10E3/uL   Monocytes Absolute 0.7 0.1 - 0.9 x10E3/uL   EOS (ABSOLUTE) 0.2 0.0 - 0.4 x10E3/uL   Basophils Absolute 0.1  0.0 - 0.2 x10E3/uL   Immature Granulocytes 0 %   Immature Grans (Abs) 0.0 0.0 - 0.1 x10E3/uL  Lipid Profile     Status: Abnormal   Collection Time: 04/30/16  9:04 AM  Result Value Ref Range   Cholesterol, Total 218 (H) 100 - 199 mg/dL   Triglycerides 177 (H) 0 - 149 mg/dL   HDL 45 >39 mg/dL   VLDL Cholesterol Cal 35 5 - 40 mg/dL   LDL Calculated 138 (H) 0 - 99 mg/dL   Chol/HDL Ratio 4.8 (H) 0.0 - 4.4 ratio units    Comment:                                   T. Chol/HDL Ratio                                             Men  Women                               1/2 Avg.Risk  3.4    3.3                                   Avg.Risk  5.0    4.4                                2X Avg.Risk  9.6    7.1                                3X Avg.Risk 23.4   11.0   TSH     Status: None   Collection Time: 04/30/16  9:04 AM  Result Value Ref Range   TSH 1.270 0.450 - 4.500 uIU/mL   No results found.  Review of Systems  Constitutional: Negative.   Respiratory: Negative.   Cardiovascular: Negative.   Gastrointestinal: Negative.   Genitourinary: Negative.     Blood pressure 127/84, pulse 67, temperature 96.5 F  (35.8 C), temperature source Tympanic, resp. rate 20, height 5\' 3"  (1.6 m), weight 180 lb (81.647 kg), SpO2 99 %. Physical Exam  Constitutional: She is oriented to person, place, and time. She appears well-developed and well-nourished.  Eyes: Conjunctivae are normal. No scleral icterus.  Neck: Neck supple.  Cardiovascular: Normal rate, regular rhythm and normal heart sounds.   Respiratory: Effort normal and breath sounds normal.  GI: Soft. Bowel sounds are normal. She exhibits no mass. There is no tenderness.  Lymphadenopathy:    She has no cervical adenopathy.  Neurological: She is alert and oriented to person, place, and time.  Skin: Skin is warm and dry.     Assessment/Plan GHistory of colon polyp with dysplasia. OK to proceed with planned colonoscopy  Christene Lye, MD 05/01/2016, 8:52 AM

## 2016-05-01 NOTE — Anesthesia Preprocedure Evaluation (Signed)
Anesthesia Evaluation  Patient identified by MRN, date of birth, ID band Patient awake    Reviewed: Allergy & Precautions, H&P , NPO status , Patient's Chart, lab work & pertinent test results  History of Anesthesia Complications Negative for: history of anesthetic complications  Airway Mallampati: III  TM Distance: >3 FB Neck ROM: limited    Dental  (+) Poor Dentition, Chipped   Pulmonary shortness of breath and with exertion, COPD, Current Smoker,    Pulmonary exam normal breath sounds clear to auscultation       Cardiovascular Exercise Tolerance: Good hypertension, (-) angina(-) Past MI Normal cardiovascular exam Rhythm:regular Rate:Normal     Neuro/Psych negative neurological ROS  negative psych ROS   GI/Hepatic Neg liver ROS, GERD  Controlled and Medicated,  Endo/Other  negative endocrine ROS  Renal/GU negative Renal ROS  negative genitourinary   Musculoskeletal  (+) Arthritis ,   Abdominal   Peds  Hematology negative hematology ROS (+)   Anesthesia Other Findings Past Medical History:   GERD (gastroesophageal reflux disease)                       Arthritis                                                    Bronchial adenoma                                            Vertigo                                                      Shortness of breath dyspnea                                 Past Surgical History:   COLONOSCOPY                                      2008,        BACK SURGERY                                                  COLON RESECTION                                 Right 2008         COLON SURGERY                                                BMI    Body Mass Index   31.89 kg/m 2    Signs and  symptoms suggestive of sleep apnea    Reproductive/Obstetrics negative OB ROS                             Anesthesia Physical Anesthesia Plan  ASA: III  Anesthesia  Plan: General   Post-op Pain Management:    Induction:   Airway Management Planned:   Additional Equipment:   Intra-op Plan:   Post-operative Plan:   Informed Consent: I have reviewed the patients History and Physical, chart, labs and discussed the procedure including the risks, benefits and alternatives for the proposed anesthesia with the patient or authorized representative who has indicated his/her understanding and acceptance.   Dental Advisory Given  Plan Discussed with: Anesthesiologist, CRNA and Surgeon  Anesthesia Plan Comments:         Anesthesia Quick Evaluation

## 2016-05-01 NOTE — Op Note (Signed)
[facility] Patient Name: [name] Procedure Date: 05/01/2016 8:55 AM MRN: [id number] Account #: [id number] Date of Birth: 25-Aug-1956 Admit Type: Outpatient Age: 60 Room: [facility] ENDO ROOM 4 Gender: Female Note Status: Finalized Procedure:            Colonoscopy Indications:          Surveillance: Personal history of adenomatous polyps on                        last colonoscopy > 5 years ago Providers:            Seeplaputhur G. Jamal Collin, MD Referring MD:         Arlis Porta, MD (Referring MD) Medicines:            General Anesthesia Complications:        No immediate complications. Procedure:            Pre-Anesthesia Assessment:                       - Using IV propofol under the supervision of an                        anesthesiologist was determined to be medically                        necessary for this procedure based on review of the                        patient's medical history, medications, and prior                        anesthesia history.                       After obtaining informed consent, the colonoscope was                        passed under direct vision. Throughout the procedure,                        the patient's blood pressure, pulse, and oxygen                        saturations were monitored continuously. The                        Colonoscope was introduced through the anus and                        advanced to the the ileocolonic anastomosis. The                        colonoscopy was performed with moderate difficulty due                        to unusual anatomy. Successful completion of the                        procedure was aided by applying abdominal pressure. The  patient tolerated the procedure fairly well. The                        quality of the bowel preparation was good. Findings:      The perianal and digital rectal examinations were normal.      Many small and  large-mouthed diverticula were found in the sigmoid colon.      A 5 mm polyp was found in the rectum. The polyp was sessile. The polyp       was removed with a hot snare. Resection was complete, but the polyp       tissue was only partially retrieved.      The exam was otherwise without abnormality. Impression:           - Moderate diverticulosis in the sigmoid colon.                       - One 5 mm polyp in the rectum, removed with a hot                        snare. Complete resection. Partial retrieval.                       - The examination was otherwise normal. Recommendation:       - Repeat colonoscopy in 5 years for surveillance. Procedure Code(s):    --- Professional ---                       [phone], Colonoscopy, flexible; with removal of tumor(s),                        polyp(s), or other lesion(s) by snare technique Diagnosis Code(s):    --- Professional ---                       Z86.010, Personal history of colonic polyps                       K62.1, Rectal polyp                       K57.30, Diverticulosis of large intestine without                        perforation or abscess without bleeding CPT copyright 2016 American Medical Association. All rights reserved. The codes documented in this report are preliminary and upon coder review may  be revised to meet current compliance requirements. Christene Lye, MD 05/01/2016 9:48:19 AM This report has been signed electronically. Number of Addenda: 0 Note Initiated On: 05/01/2016 8:55 AM Scope Withdrawal Time: 0 hours 3 minutes 27 seconds  Total Procedure Duration: 0 hours 38 minutes 26 seconds       [facility]

## 2016-05-02 LAB — SURGICAL PATHOLOGY

## 2016-05-04 ENCOUNTER — Encounter: Payer: Self-pay | Admitting: General Surgery

## 2016-05-09 ENCOUNTER — Ambulatory Visit (INDEPENDENT_AMBULATORY_CARE_PROVIDER_SITE_OTHER): Payer: BLUE CROSS/BLUE SHIELD | Admitting: Family Medicine

## 2016-05-09 ENCOUNTER — Encounter: Payer: Self-pay | Admitting: Family Medicine

## 2016-05-09 VITALS — BP 130/75 | HR 70 | Temp 98.2°F | Resp 16 | Ht 63.0 in | Wt 185.0 lb

## 2016-05-09 DIAGNOSIS — I10 Essential (primary) hypertension: Secondary | ICD-10-CM | POA: Diagnosis not present

## 2016-05-09 DIAGNOSIS — Z720 Tobacco use: Secondary | ICD-10-CM

## 2016-05-09 DIAGNOSIS — R7989 Other specified abnormal findings of blood chemistry: Secondary | ICD-10-CM | POA: Insufficient documentation

## 2016-05-09 DIAGNOSIS — E785 Hyperlipidemia, unspecified: Secondary | ICD-10-CM | POA: Insufficient documentation

## 2016-05-09 DIAGNOSIS — E669 Obesity, unspecified: Secondary | ICD-10-CM | POA: Diagnosis not present

## 2016-05-09 DIAGNOSIS — K219 Gastro-esophageal reflux disease without esophagitis: Secondary | ICD-10-CM

## 2016-05-09 DIAGNOSIS — D473 Essential (hemorrhagic) thrombocythemia: Secondary | ICD-10-CM

## 2016-05-09 DIAGNOSIS — F17200 Nicotine dependence, unspecified, uncomplicated: Secondary | ICD-10-CM

## 2016-05-09 NOTE — Progress Notes (Signed)
Name: Rachael Simpson   MRN: [id number]    DOB: [birth year]   Date:05/09/2016       Progress Note  Subjective  Chief Complaint  Chief Complaint  Patient presents with  . Hypertension  . Gastroesophageal Reflux    Nexium working    HPI Here for f/u of BP, elevated lipids, obesity and cigarette smoking.  Has reduced Smoking only slightly.  LDL, platelets and alk ptase are all sl. Elevated.  No problem-specific assessment & plan notes found for this encounter.   Past Medical History  Diagnosis Date  . GERD (gastroesophageal reflux disease)   . Arthritis   . Bronchial adenoma   . Vertigo   . Shortness of breath dyspnea     Past Surgical History  Procedure Laterality Date  . Colonoscopy  2008,  . Back surgery    . Colon resection Right 2008  . Colon surgery    . Colonoscopy with propofol N/A 05/01/2016    Procedure: COLONOSCOPY WITH PROPOFOL;  Surgeon: Rachael Lye, MD;  Location: [facility] ENDOSCOPY;  Service: Endoscopy;  Laterality: N/A;    Family History  Problem Relation Age of Onset  . Cancer Maternal Aunt   . Cancer Maternal Uncle     Social History   Social History  . Marital Status: Married    Spouse Name: N/A  . Number of Children: N/A  . Years of Education: N/A   Occupational History  . Not on file.   Social History Main Topics  . Smoking status: Current Every Day Smoker -- 1.00 packs/day    Types: Cigarettes  . Smokeless tobacco: Never Used  . Alcohol Use: No  . Drug Use: No  . Sexual Activity: Not on file   Other Topics Concern  . Not on file   Social History Narrative     Current outpatient prescriptions:  .  esomeprazole (NEXIUM) 40 MG capsule, Take 1 capsule (40 mg total) by mouth daily., Disp: 30 capsule, Rfl: 12  Allergies  Allergen Reactions  . Penicillins      Review of Systems  Constitutional: Negative for fever, chills, weight loss and malaise/fatigue.  HENT: Negative for hearing loss.   Eyes: Negative for blurred  vision and double vision.  Respiratory: Negative for cough, shortness of breath and wheezing.   Cardiovascular: Negative for chest pain, palpitations and leg swelling.  Gastrointestinal: Negative for heartburn, abdominal pain and blood in stool.  Genitourinary: Negative for dysuria, urgency and frequency.  Musculoskeletal: Negative for myalgias and joint pain.  Skin: Negative for rash.  Neurological: Negative for dizziness, tremors, weakness and headaches.      Objective  Filed Vitals:   05/09/16 1004  BP: 130/75  Pulse: 70  Temp: 98.2 F (36.8 C)  TempSrc: Oral  Resp: 16  Height: _0  (1.6 m)  Weight: 185 lb (83.915 kg)    Physical Exam  Constitutional: She is well-developed, well-nourished, and in no distress. No distress.  HENT:  Head: Normocephalic and atraumatic.  Eyes: Conjunctivae and EOM are normal. Pupils are equal, round, and reactive to light. No scleral icterus.  Neck: Normal range of motion. Neck supple. Carotid bruit is not present. No thyromegaly present.  Cardiovascular: Normal rate, regular rhythm and normal heart sounds.  Exam reveals no gallop and no friction rub.   No murmur heard. Pulmonary/Chest: Effort normal and breath sounds normal. No respiratory distress. She has no wheezes. She has no rales.  Abdominal: Soft. Bowel sounds are normal. She exhibits no distension,  no abdominal bruit and no mass. There is no tenderness.  Musculoskeletal: She exhibits no edema.  Lymphadenopathy:    She has no cervical adenopathy.  Neurological: She is alert.       Recent Results (from the past 2160 hour(s))  Comprehensive Metabolic Panel (CMET)     Status: Abnormal   Collection Time: 04/30/16  9:04 AM  Result Value Ref Range   Glucose 91 65 - 99 mg/dL   BUN 4 (L) 8 - 27 mg/dL   Creatinine, Ser 0.75 0.57 - 1.00 mg/dL   GFR calc non Af Amer 86 >59 mL/min/1.73   GFR calc Af Amer 99 >59 mL/min/1.73   BUN/Creatinine Ratio 5 (L) 12 - 28   Sodium 142 134 - 144  mmol/L   Potassium 4.9 3.5 - 5.2 mmol/L   Chloride 97 96 - 106 mmol/L   CO2 21 18 - 29 mmol/L   Calcium 9.0 8.7 - 10.3 mg/dL   Total Protein 6.9 6.0 - 8.5 g/dL   Albumin 4.0 3.6 - 4.8 g/dL   Globulin, Total 2.9 1.5 - 4.5 g/dL   Albumin/Globulin Ratio 1.4 1.2 - 2.2   Bilirubin Total 0.3 0.0 - 1.2 mg/dL   Alkaline Phosphatase 122 (H) 39 - 117 IU/L   AST 10 0 - 40 IU/L   ALT 7 0 - 32 IU/L  CBC with Differential     Status: Abnormal   Collection Time: 04/30/16  9:04 AM  Result Value Ref Range   WBC 9.1 3.4 - 10.8 x10E3/uL   RBC 4.65 3.77 - 5.28 x10E6/uL   Hemoglobin 13.9 11.1 - 15.9 g/dL   Hematocrit 41.8 34.0 - 46.6 %   MCV 90 79 - 97 fL   MCH 29.9 26.6 - 33.0 pg   MCHC 33.3 31.5 - 35.7 g/dL   RDW 15.5 (H) 12.3 - 15.4 %   Platelets 456 (H) 150 - 379 x10E3/uL   Neutrophils 49 %   Lymphs 40 %   Monocytes 7 %   Eos 3 %   Basos 1 %   Neutrophils Absolute 4.4 1.4 - 7.0 x10E3/uL   Lymphocytes Absolute 3.7 (H) 0.7 - 3.1 x10E3/uL   Monocytes Absolute 0.7 0.1 - 0.9 x10E3/uL   EOS (ABSOLUTE) 0.2 0.0 - 0.4 x10E3/uL   Basophils Absolute 0.1 0.0 - 0.2 x10E3/uL   Immature Granulocytes 0 %   Immature Grans (Abs) 0.0 0.0 - 0.1 x10E3/uL  Lipid Profile     Status: Abnormal   Collection Time: 04/30/16  9:04 AM  Result Value Ref Range   Cholesterol, Total 218 (H) 100 - 199 mg/dL   Triglycerides 177 (H) 0 - 149 mg/dL   HDL 45 >39 mg/dL   VLDL Cholesterol Cal 35 5 - 40 mg/dL   LDL Calculated 138 (H) 0 - 99 mg/dL   Chol/HDL Ratio 4.8 (H) 0.0 - 4.4 ratio units    Comment:                                   T. Chol/HDL Ratio                                             Men  Women  1/2 Avg.Risk  3.4    3.3                                   Avg.Risk  5.0    4.4                                2X Avg.Risk  9.6    7.1                                3X Avg.Risk 23.4   11.0   TSH     Status: None   Collection Time: 04/30/16  9:04 AM  Result Value Ref Range   TSH 1.270  0.450 - 4.500 uIU/mL  Surgical pathology     Status: None   Collection Time: 05/01/16  9:44 AM  Result Value Ref Range   SURGICAL PATHOLOGY      Surgical Pathology CASE: [id number] PATIENT: Rachael Simpson Surgical Pathology Report     SPECIMEN SUBMITTED: A. Rectum polyp, hot snare  CLINICAL HISTORY: None provided  PRE-OPERATIVE DIAGNOSIS: Hx colon polyp  POST-OPERATIVE DIAGNOSIS: Diverticulosis, polyp     DIAGNOSIS: A. RECTUM POLYP; HOT SNARE: - ONE TINY DETACHED AND DEGENERATED STRIP OF SURFACE EPITHELIUM, ADMIXED WITH FECAL MATERIAL. - NO DIAGNOSIS CAN BE PROVIDED.   GROSS DESCRIPTION: A. Labeled: hot snare polyp rectum Tissue fragment(s): multiple Size: aggregate, 1.0 x 0.3 x 0.1 cm Description: fecal material and possible tissue fragments  Entirely submitted in 1 cassette(s).  Final Diagnosis performed by Bryan Lemma, MD.  Electronically signed 05/02/2016 6:21:10PM    The electronic signature indicates that the named Attending Pathologist has evaluated the specimen  Technical component performed at [facility], [address] [place], [place] [address] Lab: [phone] Dir: Darrick Penna . Evette Doffing, MD  Professional component performed at [facility], [facility], [place], [place], [place] [address] Lab: [phone] Dir: Dellia Nims. Rubinas, MD       Assessment & Plan  Problem List Items Addressed This Visit      Cardiovascular and Mediastinum   HBP (high blood pressure) - Primary     Digestive   GERD (gastroesophageal reflux disease)     Hematopoietic and Hemostatic   High platelet count (HCC)     Other   Obesity   Smoker   Hyperlipidemia      No orders of the defined types were placed in this encounter.   1. Essential hypertension No mewdication  2. Gastroesophageal reflux disease without esophagitis Cont Nexium  3. Obesity Discussed weight loss  4. Smoker Discussed stopping  5. Hyperlipidemia Discussed  dietary changes  6. High platelet count ([place])

## 2016-05-09 NOTE — Patient Instructions (Signed)
Plan CBC, Lipid panel and CMP on return.

## 2016-05-15 ENCOUNTER — Encounter: Payer: Self-pay | Admitting: General Surgery

## 2016-06-05 ENCOUNTER — Encounter: Payer: Self-pay | Admitting: General Surgery

## 2016-07-18 ENCOUNTER — Ambulatory Visit (INDEPENDENT_AMBULATORY_CARE_PROVIDER_SITE_OTHER): Payer: BLUE CROSS/BLUE SHIELD | Admitting: Family Medicine

## 2016-07-18 ENCOUNTER — Encounter: Payer: Self-pay | Admitting: Family Medicine

## 2016-07-18 VITALS — BP 139/80 | HR 68 | Temp 98.5°F | Resp 16 | Ht 63.0 in | Wt 184.0 lb

## 2016-07-18 DIAGNOSIS — E785 Hyperlipidemia, unspecified: Secondary | ICD-10-CM | POA: Diagnosis not present

## 2016-07-18 DIAGNOSIS — I10 Essential (primary) hypertension: Secondary | ICD-10-CM | POA: Diagnosis not present

## 2016-07-18 MED ORDER — SIMVASTATIN 10 MG PO TABS: 10.0000 mg | ORAL_TABLET | Freq: Every day | ORAL | 3 refills | Status: AC

## 2016-07-18 NOTE — Progress Notes (Signed)
Name: Rachael Simpson   MRN: [id number]    DOB: 08-21-1956   Date:07/18/2016       Progress Note  Subjective  Chief Complaint  Chief Complaint  Patient presents with  . Hyperlipidemia  . Hypertension    HPI Here for f/u of elevated lipids.   Chemistries were ok overall and CBC and thyroid were ok.  She is feeling well.  No problem-specific Assessment & Plan notes found for this encounter.   Past Medical History:  Diagnosis Date  . Arthritis   . Bronchial adenoma   . GERD (gastroesophageal reflux disease)   . Shortness of breath dyspnea   . Vertigo     Past Surgical History:  Procedure Laterality Date  . BACK SURGERY    . COLON RESECTION Right 2008  . COLON SURGERY    . COLONOSCOPY  2008,  . COLONOSCOPY WITH PROPOFOL N/A 05/01/2016   Procedure: COLONOSCOPY WITH PROPOFOL;  Surgeon: Christene Lye, MD;  Location: [facility] ENDOSCOPY;  Service: Endoscopy;  Laterality: N/A;    Family History  Problem Relation Age of Onset  . Cancer Maternal Aunt   . Cancer Maternal Uncle     Social History   Social History  . Marital status: Married    Spouse name: N/A  . Number of children: N/A  . Years of education: N/A   Occupational History  . Not on file.   Social History Main Topics  . Smoking status: Current Every Day Smoker    Packs/day: 1.00    Types: Cigarettes  . Smokeless tobacco: Never Used  . Alcohol use No  . Drug use: No  . Sexual activity: Not on file   Other Topics Concern  . Not on file   Social History Narrative  . No narrative on file     Current Outpatient Prescriptions:  .  esomeprazole (NEXIUM) 40 MG capsule, Take 1 capsule (40 mg total) by mouth daily., Disp: 30 capsule, Rfl: 12 .  simvastatin (ZOCOR) 10 MG tablet, Take 1 tablet (10 mg total) by mouth at bedtime., Disp: 90 tablet, Rfl: 3  Allergies  Allergen Reactions  . Penicillins      Review of Systems  Constitutional: Negative for chills, diaphoresis, fever and weight loss.   HENT: Negative for hearing loss.   Eyes: Negative for blurred vision and double vision.  Respiratory: Negative for cough, shortness of breath and wheezing.   Cardiovascular: Negative for chest pain, palpitations and leg swelling.  Gastrointestinal: Negative for abdominal pain, blood in stool and heartburn.  Genitourinary: Negative for dysuria, frequency and urgency.  Skin: Negative for rash.  Neurological: Negative for tremors, weakness and headaches.      Objective  Vitals:   07/18/16 0954  BP: 139/80  Pulse: 68  Resp: 16  Temp: 98.5 F (36.9 C)  TempSrc: Oral  Weight: 184 lb (83.5 kg)  Height: 5\' 3"  (1.6 m)    Physical Exam  Constitutional: She is oriented to person, place, and time and well-developed, well-nourished, and in no distress. No distress.  HENT:  Head: Normocephalic.  Eyes: Conjunctivae and EOM are normal. Pupils are equal, round, and reactive to light. No scleral icterus.  Neck: Normal range of motion. Neck supple. Carotid bruit is not present. No thyromegaly present.  Cardiovascular: Normal rate, regular rhythm and normal heart sounds.  Exam reveals no gallop and no friction rub.   No murmur heard. Pulmonary/Chest: Effort normal and breath sounds normal. No respiratory distress. She has no wheezes. She  has no rales.  Abdominal: Soft. Bowel sounds are normal. She exhibits no distension, no abdominal bruit and no mass. There is no tenderness.  Musculoskeletal: She exhibits no edema.  Lymphadenopathy:    She has no cervical adenopathy.  Neurological: She is alert and oriented to person, place, and time.  Vitals reviewed.      Recent Results (from the past 2160 hour(s))  Comprehensive Metabolic Panel (CMET)     Status: Abnormal   Collection Time: 04/30/16  9:04 AM  Result Value Ref Range   Glucose 91 65 - 99 mg/dL   BUN 4 (L) 8 - 27 mg/dL   Creatinine, Ser 0.75 0.57 - 1.00 mg/dL   GFR calc non Af Amer 86 >59 mL/min/1.73   GFR calc Af Amer 99 >59  mL/min/1.73   BUN/Creatinine Ratio 5 (L) 12 - 28   Sodium 142 134 - 144 mmol/L   Potassium 4.9 3.5 - 5.2 mmol/L   Chloride 97 96 - 106 mmol/L   CO2 21 18 - 29 mmol/L   Calcium 9.0 8.7 - 10.3 mg/dL   Total Protein 6.9 6.0 - 8.5 g/dL   Albumin 4.0 3.6 - 4.8 g/dL   Globulin, Total 2.9 1.5 - 4.5 g/dL   Albumin/Globulin Ratio 1.4 1.2 - 2.2   Bilirubin Total 0.3 0.0 - 1.2 mg/dL   Alkaline Phosphatase 122 (H) 39 - 117 IU/L   AST 10 0 - 40 IU/L   ALT 7 0 - 32 IU/L  CBC with Differential     Status: Abnormal   Collection Time: 04/30/16  9:04 AM  Result Value Ref Range   WBC 9.1 3.4 - 10.8 x10E3/uL   RBC 4.65 3.77 - 5.28 x10E6/uL   Hemoglobin 13.9 11.1 - 15.9 g/dL   Hematocrit 41.8 34.0 - 46.6 %   MCV 90 79 - 97 fL   MCH 29.9 26.6 - 33.0 pg   MCHC 33.3 31.5 - 35.7 g/dL   RDW 15.5 (H) 12.3 - 15.4 %   Platelets 456 (H) 150 - 379 x10E3/uL   Neutrophils 49 %   Lymphs 40 %   Monocytes 7 %   Eos 3 %   Basos 1 %   Neutrophils Absolute 4.4 1.4 - 7.0 x10E3/uL   Lymphocytes Absolute 3.7 (H) 0.7 - 3.1 x10E3/uL   Monocytes Absolute 0.7 0.1 - 0.9 x10E3/uL   EOS (ABSOLUTE) 0.2 0.0 - 0.4 x10E3/uL   Basophils Absolute 0.1 0.0 - 0.2 x10E3/uL   Immature Granulocytes 0 %   Immature Grans (Abs) 0.0 0.0 - 0.1 x10E3/uL  Lipid Profile     Status: Abnormal   Collection Time: 04/30/16  9:04 AM  Result Value Ref Range   Cholesterol, Total 218 (H) 100 - 199 mg/dL   Triglycerides 177 (H) 0 - 149 mg/dL   HDL 45 >39 mg/dL   VLDL Cholesterol Cal 35 5 - 40 mg/dL   LDL Calculated 138 (H) 0 - 99 mg/dL   Chol/HDL Ratio 4.8 (H) 0.0 - 4.4 ratio units    Comment:                                   T. Chol/HDL Ratio  Men  Women                               1/2 Avg.Risk  3.4    3.3                                   Avg.Risk  5.0    4.4                                2X Avg.Risk  9.6    7.1                                3X Avg.Risk 23.4   11.0   TSH     Status: None    Collection Time: 04/30/16  9:04 AM  Result Value Ref Range   TSH 1.270 0.450 - 4.500 uIU/mL  Surgical pathology     Status: None   Collection Time: 05/01/16  9:44 AM  Result Value Ref Range   SURGICAL PATHOLOGY      Surgical Pathology CASE: [id number] PATIENT: [name] Surgical Pathology Report     SPECIMEN SUBMITTED: A. Rectum polyp, hot snare  CLINICAL HISTORY: None provided  PRE-OPERATIVE DIAGNOSIS: Hx colon polyp  POST-OPERATIVE DIAGNOSIS: Diverticulosis, polyp     DIAGNOSIS: A. RECTUM POLYP; HOT SNARE: - ONE TINY DETACHED AND DEGENERATED STRIP OF SURFACE EPITHELIUM, ADMIXED WITH FECAL MATERIAL. - NO DIAGNOSIS CAN BE PROVIDED.   GROSS DESCRIPTION: A. Labeled: hot snare polyp rectum Tissue fragment(s): multiple Size: aggregate, 1.0 x 0.3 x 0.1 cm Description: fecal material and possible tissue fragments  Entirely submitted in 1 cassette(s).  Final Diagnosis performed by Bryan Lemma, MD.  Electronically signed 05/02/2016 6:21:10PM    The electronic signature indicates that the named Attending Pathologist has evaluated the specimen  Technical component performed at [facility], [address] [place], [place] [address] Lab: [phone] Dir: Darrick Penna . Evette Doffing, MD  Professional component performed at [facility], [facility], [place], [place], [place] [address] Lab: [phone] Dir: Dellia Nims. Rubinas, MD       Assessment & Plan  Problem List Items Addressed This Visit      Cardiovascular and Mediastinum   HBP (high blood pressure)   Relevant Medications   simvastatin (ZOCOR) 10 MG tablet     Other   Hyperlipidemia - Primary   Relevant Medications   simvastatin (ZOCOR) 10 MG tablet    Other Visit Diagnoses   None.     Meds ordered this encounter  Medications  . simvastatin (ZOCOR) 10 MG tablet    Sig: Take 1 tablet (10 mg total) by mouth at bedtime.    Dispense:  90 tablet    Refill:  3   1.  Hyperlipidemia  - simvastatin (ZOCOR) 10 MG tablet; Take 1 tablet (10 mg total) by mouth at bedtime.  Dispense: 90 tablet; Refill: 3 RTC-3 months   2. Essential hypertension

## 2016-07-18 NOTE — Patient Instructions (Signed)
Plan Lipid panel and CMP prior to return

## 2016-10-09 ENCOUNTER — Encounter: Payer: Self-pay | Admitting: Family Medicine

## 2016-10-09 ENCOUNTER — Telehealth: Payer: Self-pay | Admitting: Family Medicine

## 2016-10-09 NOTE — Telephone Encounter (Signed)
 Received call while covering as on-call provider for [facility] today at approx 1752 by answering service who connected me to [facility] paramedic Derrill Kay. She reported that they were called when patient Rachael Simpson (05/08/1956) was found to be dead on arrival. EMS states that she was found laying, and did not see any obvious trauma or injury. Called PCP office (Dr Dicky Doe) to gather her past medical history or any information that would help in determining possible cause of death prior to speaking with Medical Examiner for potential case.  After reviewing her chart, called Derrill Kay paramedic back at [phone]. Patient was a 15 caucasian female with past history of obesity, HTN, HLD, active smoker. She was taking Simvastatin and PPI, but was not requiring anti-HTN therapy. I do not see from her record if she was taking aspirin daily. Significant family history of older brother passing from MI, family history of cardiac disease. Patient does not have known history of MI or CVA, or DM. Her last appointment at [facility] was in 06/2016 without any acute concerns, follow-up for HTN, HLD, no new medication changes. She was scheduled to follow-up in 10/2016. Additionally, I checked [place] Controlled Substance Reporting Database and do not see any controlled substance rx at all in the past 6 months. This information was provided to [place], who will then contact local Medical Examiner, and if they decide not to open a ME case, then PCP would receive the death certificate to sign. I am not the patient's PCP but I have agreed to sign if needed, otherwise, it would be sent to Dr Dicky Doe at [facility].  This note was forwarded to PCP Dr Luan Pulling  Nobie Putnam, [facility] , 6:28 PM

## 2016-10-10 NOTE — Telephone Encounter (Signed)
Noted-jh 

## 2016-10-11 ENCOUNTER — Encounter: Payer: Self-pay | Admitting: Family Medicine

## 2016-10-11 NOTE — Progress Notes (Signed)
 Patient passed on  at approx 1723. [facility] initial evaluation on scene, contacted medical examiner, case was not opened by medical examiner. Death certificate sent to [facility].  Discussed recent passing with patient's PCP Dr Luan Pulling, who contacted [place] office to review case as well.  Death certificate was signed by me today 10-27-2016. Immediate cause of death was: a) Atherosclerotic cardiovascular disease (minutes), b) Hypertensive heart disease (2+ years). Other contributing causes were: Hyperlipidemia, Obesity. Yes, tobacco played a role in death. Natural death. Death certificate submitted to [facility] office to notify [place].  Nobie Putnam, [facility] 10/27/16, 8:34 AM

## 2016-10-23 DEATH — deceased

## 2016-10-24 ENCOUNTER — Ambulatory Visit: Payer: BLUE CROSS/BLUE SHIELD | Admitting: Family Medicine
